# Patient Record
Sex: Female | Born: 2000 | Race: Asian | Hispanic: No | Marital: Single | Smoking: Never smoker
Health system: Southern US, Community
[De-identification: ages and names within clinical notes are randomized; demographics above are authoritative.]

## PROBLEM LIST (undated history)

## (undated) DIAGNOSIS — J45909 Unspecified asthma, uncomplicated: Secondary | ICD-10-CM

## (undated) HISTORY — DX: Unspecified asthma, uncomplicated: J45.909

---
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
## 2022-01-27 ENCOUNTER — Emergency Department (HOSPITAL_COMMUNITY)
Admission: EM | Admit: 2022-01-27 | Discharge: 2022-01-27 | Disposition: A | Discharge status: Home / Self Care | Payer: Medicaid Other | Attending: Student | Admitting: Student

## 2022-01-27 ENCOUNTER — Other Ambulatory Visit: Payer: Self-pay

## 2022-01-27 ENCOUNTER — Emergency Department (HOSPITAL_COMMUNITY): Payer: Medicaid Other

## 2022-01-27 ENCOUNTER — Encounter (HOSPITAL_COMMUNITY): Payer: Self-pay

## 2022-01-27 DIAGNOSIS — D72829 Elevated white blood cell count, unspecified: Secondary | ICD-10-CM | POA: Insufficient documentation

## 2022-01-27 DIAGNOSIS — N3000 Acute cystitis without hematuria: Secondary | ICD-10-CM | POA: Insufficient documentation

## 2022-01-27 DIAGNOSIS — J45909 Unspecified asthma, uncomplicated: Secondary | ICD-10-CM | POA: Diagnosis not present

## 2022-01-27 DIAGNOSIS — R109 Unspecified abdominal pain: Secondary | ICD-10-CM | POA: Diagnosis not present

## 2022-01-27 LAB — CBC WITH DIFFERENTIAL/PLATELET
Abs Immature Granulocytes: 0.03 10*3/uL (ref 0.00–0.07)
Basophils Absolute: 0 10*3/uL (ref 0.0–0.1)
Basophils Relative: 0 %
Eosinophils Absolute: 0.3 10*3/uL (ref 0.0–0.5)
Eosinophils Relative: 2 %
HCT: 39 % (ref 36.0–46.0)
Hemoglobin: 12.7 g/dL (ref 12.0–15.0)
Immature Granulocytes: 0 %
Lymphocytes Relative: 38 %
Lymphs Abs: 4.9 10*3/uL — ABNORMAL HIGH (ref 0.7–4.0)
MCH: 25.6 pg — ABNORMAL LOW (ref 26.0–34.0)
MCHC: 32.6 g/dL (ref 30.0–36.0)
MCV: 78.6 fL — ABNORMAL LOW (ref 80.0–100.0)
Monocytes Absolute: 0.6 10*3/uL (ref 0.1–1.0)
Monocytes Relative: 5 %
Neutro Abs: 7 10*3/uL (ref 1.7–7.7)
Neutrophils Relative %: 55 %
Platelets: 365 10*3/uL (ref 150–400)
RBC: 4.96 MIL/uL (ref 3.87–5.11)
RDW: 13.5 % (ref 11.5–15.5)
WBC: 12.9 10*3/uL — ABNORMAL HIGH (ref 4.0–10.5)
nRBC: 0 % (ref 0.0–0.2)

## 2022-01-27 LAB — COMPREHENSIVE METABOLIC PANEL
ALT: 12 U/L (ref 0–44)
AST: 20 U/L (ref 15–41)
Albumin: 4.4 g/dL (ref 3.5–5.0)
Alkaline Phosphatase: 50 U/L (ref 38–126)
Anion gap: 12 (ref 5–15)
BUN: 11 mg/dL (ref 6–20)
CO2: 21 mmol/L — ABNORMAL LOW (ref 22–32)
Calcium: 9.3 mg/dL (ref 8.9–10.3)
Chloride: 106 mmol/L (ref 98–111)
Creatinine, Ser: 0.93 mg/dL (ref 0.44–1.00)
GFR, Estimated: >60 mL/min (ref >60)
Glucose, Bld: 99 mg/dL (ref 70–99)
Potassium: 3.6 mmol/L (ref 3.5–5.1)
Sodium: 139 mmol/L (ref 135–145)
Total Bilirubin: 0.8 mg/dL (ref 0.3–1.2)
Total Protein: 7.6 g/dL (ref 6.5–8.1)

## 2022-01-27 LAB — URINALYSIS, ROUTINE W REFLEX MICROSCOPIC
Bilirubin Urine: NEGATIVE
Glucose, UA: NEGATIVE mg/dL
Hgb urine dipstick: NEGATIVE
Ketones, ur: 80 mg/dL — AB
Nitrite: NEGATIVE
Protein, ur: NEGATIVE mg/dL
Specific Gravity, Urine: 1.026 (ref 1.005–1.030)
pH: 5 (ref 5.0–8.0)

## 2022-01-27 LAB — I-STAT BETA HCG BLOOD, ED (MC, WL, AP ONLY): I-stat hCG, quantitative: <5 m[IU]/mL (ref <5)

## 2022-01-27 LAB — LIPASE, BLOOD: Lipase: 25 U/L (ref 11–51)

## 2022-01-27 MED ORDER — CEFADROXIL 500 MG PO CAPS
500.0000 mg | ORAL_CAPSULE | Freq: Two times a day (BID) | ORAL | Status: DC
  Administered 2022-01-27: 500 mg via ORAL
  Filled 2022-01-27 (×2): qty 1

## 2022-01-27 MED ORDER — VASOPRESSIN 20 UNITS/100 ML INFUSION FOR SHOCK: 0.0000 [IU]/min | INTRAVENOUS | Status: DC

## 2022-01-27 MED ORDER — CEFADROXIL 500 MG PO CAPS: 500.0000 mg | ORAL_CAPSULE | Freq: Two times a day (BID) | ORAL | 0 refills | Status: AC

## 2022-01-27 MED ORDER — IOHEXOL 300 MG/ML  SOLN
80.0000 mL | Freq: Once | INTRAMUSCULAR | Status: AC | PRN
  Administered 2022-01-27: 80 mL via INTRAVENOUS

## 2022-01-27 NOTE — ED Provider Triage Note (Signed)
Emergency Medicine Provider Triage Evaluation Note  Brenda Dickson , a 21 y.o. female  was evaluated in triage.  Pt complains of abdominal pain that began when she woke up @ 4:20AM. Pain is periumbilical, crampy in nature, waxing/waning no alleviating/aggravating factors. LMP @ the end of last month, not due for her cycle yet.   Review of Systems  Positive: Abdominal pain Negative: N/v/d, fever, vaginal bleeding, dysuria,   Physical Exam  BP 107/65 (BP Location: Right Arm)   Pulse 97   Temp 98.3 F (36.8 C) (Oral)   Resp 18   SpO2 97%  Gen:   Awake, no distress   Resp:  Normal effort  MSK:   Moves extremities without difficulty  Other:  No peritoneal signs.   Medical Decision Making  Medically screening exam initiated at 5:05 AM.  Appropriate orders placed.  Brenda Dickson was informed that the remainder of the evaluation will be completed by another provider, this initial triage assessment does not replace that evaluation, and the importance of remaining in the ED until their evaluation is complete.  Abdominal pain   Brenda Anderson, PA-C 01/27/22 [id number]

## 2022-01-27 NOTE — ED Provider Notes (Signed)
[facility] EMERGENCY DEPARTMENT Provider Note  CSN: [id number] Arrival date & time: 01/27/22 0445  Chief Complaint(s) Abdominal Pain  HPI Brenda Dickson is a 21 y.o. female who presents emergency department for evaluation of abdominal pain.  Patient states that pain began abruptly at 4:30 AM.  She did describes the pain as crampy and located in the suprapubic region.  She endorses nausea but denies vomiting or diarrhea.  Denies associated chest pain, shortness of breath, headache, fever or other systemic symptoms.  Unfortunately, the patient had a prolonged ER waiting room stay and on my evaluation her pain has improved but not totally resolved.  Denies constipation, vaginal bleeding or vaginal discharge.   Past Medical History Past Medical History:  Diagnosis Date   Asthma    [employer] 04/01/2020   Patient Active Problem List   Diagnosis Date Noted   Asthma 09/23/2011   Home Medication(s) Prior to Admission medications   Medication Sig Start Date End Date Taking? Authorizing Provider  cefadroxil (DURICEF) 500 MG capsule Take 1 capsule (500 mg total) by mouth 2 (two) times daily for 7 days. 01/27/22 02/03/22 Yes Fiorella Hanahan, MD  dimenhyDRINATE (DRAMAMINE PO) Take 2 tablets by mouth daily as needed (motion sickness).   Yes [provider]  Melatonin 10 MG TABS Take 10 mg by mouth at bedtime as needed (sleep).   Yes [provider]  scopolamine (TRANSDERM-SCOP) 1 MG/3DAYS Place 1 patch onto the skin every three (3) days as needed (motion sickness).   Yes [provider]                                                                                                                                    Past Surgical History History reviewed. No pertinent surgical history. Family History History reviewed. No pertinent family history.  Social History Social History   Tobacco Use   Smoking status: Never   Smokeless tobacco: Never  Substance  Use Topics   Alcohol use: No   Drug use: No   Allergies Patient has no known allergies.  Review of Systems Review of Systems  Gastrointestinal:  Positive for abdominal pain and nausea.    Physical Exam Vital Signs  I have reviewed the triage vital signs BP 115/69   Pulse 66   Temp 98 F (36.7 C) Comment: Simultaneous filing. User may not have seen previous data.  Resp 16 Comment: Simultaneous filing. User may not have seen previous data.  SpO2 100%   Physical Exam Vitals and nursing note reviewed.  Constitutional:      General: She is not in acute distress.    Appearance: She is well-developed.  HENT:     Head: Normocephalic and atraumatic.  Eyes:     Conjunctiva/sclera: Conjunctivae normal.  Cardiovascular:     Rate and Rhythm: Normal rate and regular rhythm.     Heart sounds: No murmur heard. Pulmonary:  Effort: Pulmonary effort is normal. No respiratory distress.     Breath sounds: Normal breath sounds.  Abdominal:     Palpations: Abdomen is soft.     Tenderness: There is abdominal tenderness in the periumbilical area and suprapubic area.  Musculoskeletal:        General: No swelling.     Cervical back: Neck supple.  Skin:    General: Skin is warm and dry.     Capillary Refill: Capillary refill takes less than 2 seconds.  Neurological:     Mental Status: She is alert.  Psychiatric:        Mood and Affect: Mood normal.     ED Results and Treatments Labs (all labs ordered are listed, but only abnormal results are displayed) Labs Reviewed  COMPREHENSIVE METABOLIC PANEL - Abnormal; Notable for the following components:      Result Value   CO2 21 (*)    All other components within normal limits  CBC WITH DIFFERENTIAL/PLATELET - Abnormal; Notable for the following components:   WBC 12.9 (*)    MCV 78.6 (*)    MCH 25.6 (*)    Lymphs Abs 4.9 (*)    All other components within normal limits  URINALYSIS, ROUTINE W REFLEX MICROSCOPIC - Abnormal; Notable  for the following components:   APPearance HAZY (*)    Ketones, ur 80 (*)    Leukocytes,Ua LARGE (*)    Bacteria, UA RARE (*)    All other components within normal limits  LIPASE, BLOOD  I-STAT BETA HCG BLOOD, ED ([facility], [facility], [facility] ONLY)                                                                                                                          Radiology CT ABDOMEN PELVIS W CONTRAST  Result Date: 01/27/2022 CLINICAL DATA:  Acute periumbilical abdominal pain. EXAM: CT ABDOMEN AND PELVIS WITH CONTRAST TECHNIQUE: Multidetector CT imaging of the abdomen and pelvis was performed using the standard protocol following bolus administration of intravenous contrast. RADIATION DOSE REDUCTION: This exam was performed according to the departmental dose-optimization program which includes automated exposure control, adjustment of the mA and/or kV according to patient size and/or use of iterative reconstruction technique. CONTRAST:  47mL OMNIPAQUE IOHEXOL 300 MG/ML  SOLN COMPARISON:  None Available. FINDINGS: Lower chest: No acute abnormality. Hepatobiliary: No focal liver abnormality is seen. No gallstones, gallbladder wall thickening, or biliary dilatation. Pancreas: Unremarkable. No pancreatic ductal dilatation or surrounding inflammatory changes. Spleen: Normal in size without focal abnormality. Adrenals/Urinary Tract: Adrenal glands are unremarkable. Kidneys are normal, without renal calculi, focal lesion, or hydronephrosis. Bladder is unremarkable. Stomach/Bowel: The stomach appears normal. There is no evidence of bowel obstruction or inflammation. The appendix is not clearly visualized, but no definite inflammation is noted in the right lower quadrant. Vascular/Lymphatic: No significant vascular findings are present. No enlarged abdominal or pelvic lymph nodes. Reproductive: Uterus and bilateral adnexa are unremarkable. Other: No abdominal wall hernia or abnormality.  No abdominopelvic ascites.  Musculoskeletal: No acute or significant osseous findings. IMPRESSION: No definite abnormality seen in the abdomen or pelvis. Electronically Signed   By: Lupita RaiderJames  Green Jr M.D.   On: 01/27/2022 13:45    Pertinent labs & imaging results that were available during my care of the patient were reviewed by me and considered in my medical decision making (see MDM for details).  Medications Ordered in ED Medications  cefadroxil (DURICEF) capsule 500 mg (500 mg Oral Given 01/27/22 1409)  iohexol (OMNIPAQUE) 300 MG/ML solution 80 mL (80 mLs Intravenous Contrast Given 01/27/22 1334)                                                                                                                                     Procedures Procedures  (including critical care time)  Medical Decision Making / ED Course   This patient presents to the ED for concern of abdominal pain, this involves an extensive number of treatment options, and is a complaint that carries with it a high risk of complications and morbidity.  The differential diagnosis includes cystitis, appendicitis, constipation, nephrolithiasis, intra-abdominal infection, musculoskeletal pain  MDM: Patient seen in the emergency room for evaluation of abdominal pain.  Physical exam reveals tenderness over the periumbilical region and in the suprapubic area.  Laboratory evaluation with leukocytosis to 12.9 but is otherwise unremarkable.  Urinalysis with large leuk esterase, 11-20 white blood cells, rare bacteria and 6-10 squamous epithelial cells.  Given patient's symptoms are in the suprapubic region with associated cramping and a possibly positive urinalysis, CT abdomen pelvis unremarkable with no evidence of appendicitis.  I do think the patient likely is suffering from urinary tract infection and we will treat with Duricef.  She was instructed to follow-up with her outpatient PCP and given strict return precautions which she voiced understanding.  Patient then  discharged with outpatient PCP follow-up.   Additional history obtained: -Additional history obtained from mother -External records from outside source obtained and reviewed including: Chart review including previous notes, labs, imaging, consultation notes   Lab Tests: -I ordered, reviewed, and interpreted labs.   The pertinent results include:   Labs Reviewed  COMPREHENSIVE METABOLIC PANEL - Abnormal; Notable for the following components:      Result Value   CO2 21 (*)    All other components within normal limits  CBC WITH DIFFERENTIAL/PLATELET - Abnormal; Notable for the following components:   WBC 12.9 (*)    MCV 78.6 (*)    MCH 25.6 (*)    Lymphs Abs 4.9 (*)    All other components within normal limits  URINALYSIS, ROUTINE W REFLEX MICROSCOPIC - Abnormal; Notable for the following components:   APPearance HAZY (*)    Ketones, ur 80 (*)    Leukocytes,Ua LARGE (*)    Bacteria, UA RARE (*)    All other components within normal limits  LIPASE, BLOOD  I-STAT  BETA HCG BLOOD, ED ([facility], [facility], [facility] ONLY)       Imaging Studies ordered: I ordered imaging studies including CTAP I independently visualized and interpreted imaging. I agree with the radiologist interpretation   Medicines ordered and prescription drug management: Meds ordered this encounter  Medications   iohexol (OMNIPAQUE) 300 MG/ML solution 80 mL   cefadroxil (DURICEF) capsule 500 mg   cefadroxil (DURICEF) 500 MG capsule    Sig: Take 1 capsule (500 mg total) by mouth 2 (two) times daily for 7 days.    Dispense:  14 capsule    Refill:  0   DISCONTD: vasopressin (PITRESSIN) 20 Units in sodium chloride 0.9 % 100 mL infusion-*FOR SHOCK*    -I have reviewed the patients home medicines and have made adjustments as needed  Critical interventions none  Cardiac Monitoring: The patient was maintained on a cardiac monitor.  I personally viewed and interpreted the cardiac monitored which showed an underlying rhythm of:  NSR  Social Determinants of Health:  Factors impacting patients care include: none   Reevaluation: After the interventions noted above, I reevaluated the patient and found that they have :improved  Co morbidities that complicate the patient evaluation  Past Medical History:  Diagnosis Date   Asthma    [employer] 04/01/2020      Dispostion: I considered admission for this patient, but with overall negative work-up and uncomplicated UTI, patient safe for discharge with outpatient follow-up.  Does not meet inpatient criteria for admission.     Final Clinical Impression(s) / ED Diagnoses Final diagnoses:  Acute cystitis without hematuria     @    Glendora Score, MD 01/27/22 [id number]

## 2022-01-27 NOTE — ED Triage Notes (Signed)
Pt reports she was having abd pain starting at 0430am. Pt reported she had a sharp cramp. Pt denies any N&V&D.

## 2022-01-27 NOTE — ED Notes (Signed)
DC instructions reviewed with pt. PT verbalized understanding. PT DC °

## 2022-01-29 NOTE — Progress Notes (Signed)
Subjective:    Brenda Dickson - 21 y.o. female MRN [id number]  Date of birth: 08-24-2000  HPI  Brenda Dickson is to establish care and emergency department follow-up.  Current issues and/or concerns: Emergency department follow-up: 01/27/2022 [facility] per MD note: MDM: Patient seen in the emergency room for evaluation of abdominal pain.  Physical exam reveals tenderness over the periumbilical region and in the suprapubic area.  Laboratory evaluation with leukocytosis to 12.9 but is otherwise unremarkable.  Urinalysis with large leuk esterase, 11-20 white blood cells, rare bacteria and 6-10 squamous epithelial cells.  Given patient's symptoms are in the suprapubic region with associated cramping and a possibly positive urinalysis, CT abdomen pelvis unremarkable with no evidence of appendicitis.  I do think the patient likely is suffering from urinary tract infection and we will treat with Duricef.  She was instructed to follow-up with her outpatient PCP and given strict return precautions which she voiced understanding.  Patient then discharged with outpatient PCP follow-up.  01/30/2022: Reports lingering stomach pain without additional symptoms. Began antibiotics 2 days ago.   2. Left ankle pain: Reports 2 weeks ago while hiking she twisted her ankle and thinks she sprained it. Reports was swollen at that time. Since then swelling has resolved. However, describes aching when walking. No additional symptoms.  3. Ear wax: Feels left ear wax is clogged. Tried digging it out. Reports she does wear headphones with volume probably louder than it should be.   4. Anxiety depression: Currently primarily related to family issues. Reports she is trying to find a job. Reports she tends to feel guilty about things. 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 History of having issues with school but currently no concerns with the same. Today when I asked if she has plans of suicide/self-harm/homicide she declined.  Reports she does have a history of self-cutting but has been at least 1 year since she did this last. Reports no current plans. Reports has thought of starving herself. States "Sometimes I just think it would be better if I drain into a puddle."     01/30/2022    9:57 AM  Depression screen PHQ 2/9  Decreased Interest 1  Down, Depressed, Hopeless 2  PHQ - 2 Score 3  Altered sleeping 3  Tired, decreased energy 1  Change in appetite 1  Feeling bad or failure about yourself  2  Trouble concentrating 0  Moving slowly or fidgety/restless 0  Suicidal thoughts 1  PHQ-9 Score 11  Difficult doing work/chores Somewhat difficult     ROS per HPI     Health Maintenance:  Health Maintenance Due  Topic Date Due   COVID-19 Vaccine (1) Never done   Hepatitis C Screening  Never done     Past Medical History: Patient Active Problem List   Diagnosis Date Noted   Asthma 09/23/2011     Social History   reports that she has never smoked. She has never been exposed to tobacco smoke. She has never used smokeless tobacco. She reports that she does not drink alcohol and does not use drugs.   Family History  family history is not on file.   Medications: reviewed and updated   Objective:   Physical Exam BP 122/79 (BP Location: Left Arm, Patient Position: Sitting, Cuff Size: Small)   Pulse 74   Temp 98.3 F (36.8 C)   Resp 18   Ht 4' 11.06" (1.5 m)   Wt 104 lb (47.2 kg)   SpO2 98%  BMI 20.97 kg/m   Physical Exam HENT:     Head: Normocephalic and atraumatic.     Right Ear: Tympanic membrane, ear canal and external ear normal.     Left Ear: Ear canal and external ear normal. There is impacted cerumen.  Eyes:     Extraocular Movements: Extraocular movements intact.     Conjunctiva/sclera: Conjunctivae normal.     Pupils: Pupils are equal, round, and reactive to light.  Cardiovascular:     Rate and Rhythm: Normal rate and regular rhythm.     Pulses: Normal pulses.     Heart  sounds: Normal heart sounds.  Pulmonary:     Effort: Pulmonary effort is normal.     Breath sounds: Normal breath sounds.  Musculoskeletal:        General: Normal range of motion.     Cervical back: Normal range of motion and neck supple.     Left lower leg: Normal.     Left ankle: Normal.     Left foot: Normal.  Neurological:     General: No focal deficit present.     Mental Status: She is alert and oriented to person, place, and time.  Psychiatric:        Mood and Affect: Mood normal.        Behavior: Behavior normal.       Assessment & Plan:  1. Encounter to establish care - Patient presents today to establish care.  - Return for annual physical examination, labs, and health maintenance. Arrive fasting meaning having no food for at least 8 hours prior to appointment. You may have only water or black coffee. Please take scheduled medications as normal.  2. Acute cystitis without hematuria - Continue Duricef as prescribed at emergency department discharge.  3. Abdominal pain, unspecified abdominal location - Cervicovaginal self-swab to screen for chlamydia, gonorrhea, trichomonas, bacterial vaginitis, and candida vaginitis. - Referral to Gastroenterology for further evaluation and management. - Cervicovaginal ancillary only - Ambulatory referral to Gastroenterology  4. Ceruminosis, left - Carbamide Peroxide otic solution as prescribed. Counseled on medication adherence and adverse effects.  - Return in 1 week for ear lavage with CMA.  - carbamide peroxide (DEBROX) 6.5 % OTIC solution; Place 5 drops into the left ear 2 (two) times daily. Do not use more than 4 days in a row.  Dispense: 15 mL; Refill: 0  5. Sprain of left ankle, unspecified ligament, initial encounter - Counseled on rest, ice elevation, as needed.  - Try course of over-the-counter analgesics.  - Follow-up with primary provider as scheduled.  6. Anxiety and depression - Patient denies thoughts of self-harm,  suicidal ideations, homicidal ideations. - Newly diagnosed.  - Begin Sertraline as prescribed.  Do not drink alcohol or use illicit substances with with this medication.  Avoid driving or hazardous activity until you know how this medication will affect you. Your reactions could be impaired. Dizziness or fainting can cause falls, accidents, or severe injuries. Common side effects include drowsiness, nausea, constipation, loss of appetite, dry mouth, increased sweating. Call your provider if you have pounding heartbeats or fluttering in your chest, a light-headed feeling like you may pass out, easy bruising/unusal bleeding, vision change, difficult or painful urination, impotence/sexual problems, liver problems (right-sided upper stomach pain, itching, dark urine, yellowing of skin or eyes/jaundice, low levels of sodium in the body (headache, confusion, slurred speech, severe weakness, vomiting, loss of coordination, feeling unsteady), or manic episodes (racing thoughts, increased energy, decreased need for sleep, risk-taking  behavior, being agitated, talkative) Seek medical attention immediately if you have symptoms of serotonin syndrome such as agitation, hallucinations, fever, sweating, shivering, fast heart rate, muscle stiffness, twitching, loss of coordination, nausea, vomiting, or diarrhea Report any new or worsening symptoms to your provider, such as but not limited to: mood or behavior changes, anxiety, panic attacks, trouble sleeping, or if you feel impulsive, irritable, agitated, hostile, aggressive, restless, hyperactive (mentally or physically), more depressed, or have thoughts about suicide or hurting yourself - Referral to Psychiatry for further evaluation and management.  - sertraline (ZOLOFT) 25 MG tablet; Take 1 tablet (25 mg total) by mouth daily.  Dispense: 30 tablet; Refill: 0 - Ambulatory referral to Psychiatry      Patient was given clear instructions to go to Emergency  Department or return to medical center if symptoms don't improve, worsen, or new problems develop.The patient verbalized understanding.  I discussed the assessment and treatment plan with the patient. The patient was provided an opportunity to ask questions and all were answered. The patient agreed with the plan and demonstrated an understanding of the instructions.   The patient was advised to call back or seek an in-person evaluation if the symptoms worsen or if the condition fails to improve as anticipated.    Ricky Stabs, NP 01/30/2022, 10:18 AM Primary Care at [facility]

## 2022-01-30 ENCOUNTER — Ambulatory Visit (INDEPENDENT_AMBULATORY_CARE_PROVIDER_SITE_OTHER): Payer: Medicaid Other | Admitting: Family

## 2022-01-30 ENCOUNTER — Other Ambulatory Visit (HOSPITAL_COMMUNITY)
Admission: RE | Admit: 2022-01-30 | Discharge: 2022-01-30 | Disposition: A | Discharge status: Home / Self Care | Payer: Medicaid Other | Source: Ambulatory Visit | Attending: Family | Admitting: Family

## 2022-01-30 ENCOUNTER — Encounter: Payer: Self-pay | Admitting: Family

## 2022-01-30 VITALS — BP 122/79 | HR 74 | Temp 98.3°F | Resp 18 | Ht 59.06 in | Wt 104.0 lb

## 2022-01-30 DIAGNOSIS — H6122 Impacted cerumen, left ear: Secondary | ICD-10-CM

## 2022-01-30 DIAGNOSIS — Z7689 Persons encountering health services in other specified circumstances: Secondary | ICD-10-CM

## 2022-01-30 DIAGNOSIS — S93402A Sprain of unspecified ligament of left ankle, initial encounter: Secondary | ICD-10-CM | POA: Diagnosis not present

## 2022-01-30 DIAGNOSIS — N3000 Acute cystitis without hematuria: Secondary | ICD-10-CM | POA: Diagnosis not present

## 2022-01-30 DIAGNOSIS — R109 Unspecified abdominal pain: Secondary | ICD-10-CM | POA: Insufficient documentation

## 2022-01-30 DIAGNOSIS — F419 Anxiety disorder, unspecified: Secondary | ICD-10-CM | POA: Diagnosis not present

## 2022-01-30 DIAGNOSIS — F32A Depression, unspecified: Secondary | ICD-10-CM | POA: Diagnosis not present

## 2022-01-30 MED ORDER — SERTRALINE HCL 25 MG PO TABS: 25.0000 mg | ORAL_TABLET | Freq: Every day | ORAL | 0 refills | Status: DC

## 2022-01-30 MED ORDER — CARBAMIDE PEROXIDE 6.5 % OT SOLN: 5.0000 [drp] | Freq: Two times a day (BID) | OTIC | 0 refills | Status: AC

## 2022-01-30 NOTE — Progress Notes (Signed)
Pt presents to establish care and ED f/u, states she is still experiencing abdominal pain states she just started antibiotics

## 2022-01-30 NOTE — Patient Instructions (Signed)
Thank you for choosing Primary Care at [facility] for your medical home!    Brenda Dickson was seen by Rema Fendt, NP today.   Brenda Dickson's primary care provider is Rema Fendt, NP.   For the best care possible,  you should try to see Brenda Stabs, NP whenever you come to office.   We look forward to seeing you again soon!  If you have any questions about your visit today,  please call us at [phone]  Or feel free to reach your provider via MyChart.     Urinary Tract Infection, Adult  A urinary tract infection (UTI) is an infection of any part of the urinary tract. The urinary tract includes the kidneys, ureters, bladder, and urethra. These organs make, store, and get rid of urine in the body. An upper UTI affects the ureters and kidneys. A lower UTI affects the bladder and urethra. What are the causes? Most urinary tract infections are caused by bacteria in your genital area around your urethra, where urine leaves your body. These bacteria grow and cause inflammation of your urinary tract. What increases the risk? You are more likely to develop this condition if: You have a urinary catheter that stays in place. You are not able to control when you urinate or have a bowel movement (incontinence). You are female and you: Use a spermicide or diaphragm for birth control. Have low estrogen levels. Are pregnant. You have certain genes that increase your risk. You are sexually active. You take antibiotic medicines. You have a condition that causes your flow of urine to slow down, such as: An enlarged prostate, if you are female. Blockage in your urethra. A kidney stone. A nerve condition that affects your bladder control (neurogenic bladder). Not getting enough to drink, or not urinating often. You have certain medical conditions, such as: Diabetes. A weak disease-fighting system (immunesystem). Sickle cell disease. Gout. Spinal cord injury. What are the signs or  symptoms? Symptoms of this condition include: Needing to urinate right away (urgency). Frequent urination. This may include small amounts of urine each time you urinate. Pain or burning with urination. Blood in the urine. Urine that smells bad or unusual. Trouble urinating. Cloudy urine. Vaginal discharge, if you are female. Pain in the abdomen or the lower back. You may also have: Vomiting or a decreased appetite. Confusion. Irritability or tiredness. A fever or chills. Diarrhea. The first symptom in older adults may be confusion. In some cases, they may not have any symptoms until the infection has worsened. How is this diagnosed? This condition is diagnosed based on your medical history and a physical exam. You may also have other tests, including: Urine tests. Blood tests. Tests for STIs (sexually transmitted infections). If you have had more than one UTI, a cystoscopy or imaging studies may be done to determine the cause of the infections. How is this treated? Treatment for this condition includes: Antibiotic medicine. Over-the-counter medicines to treat discomfort. Drinking enough water to stay hydrated. If you have frequent infections or have other conditions such as a kidney stone, you may need to see a health care provider who specializes in the urinary tract (urologist). In rare cases, urinary tract infections can cause sepsis. Sepsis is a life-threatening condition that occurs when the body responds to an infection. Sepsis is treated in the hospital with IV antibiotics, fluids, and other medicines. Follow these instructions at home:  Medicines Take over-the-counter and prescription medicines only as told by your health care  provider. If you were prescribed an antibiotic medicine, take it as told by your health care provider. Do not stop using the antibiotic even if you start to feel better. General instructions Make sure you: Empty your bladder often and completely.  Do not hold urine for long periods of time. Empty your bladder after sex. Wipe from front to back after urinating or having a bowel movement if you are female. Use each tissue only one time when you wipe. Drink enough fluid to keep your urine pale yellow. Keep all follow-up visits. This is important. Contact a health care provider if: Your symptoms do not get better after 1-2 days. Your symptoms go away and then return. Get help right away if: You have severe pain in your back or your lower abdomen. You have a fever or chills. You have nausea or vomiting. Summary A urinary tract infection (UTI) is an infection of any part of the urinary tract, which includes the kidneys, ureters, bladder, and urethra. Most urinary tract infections are caused by bacteria in your genital area. Treatment for this condition often includes antibiotic medicines. If you were prescribed an antibiotic medicine, take it as told by your health care provider. Do not stop using the antibiotic even if you start to feel better. Keep all follow-up visits. This is important. This information is not intended to replace advice given to you by your health care provider. Make sure you discuss any questions you have with your health care provider. Document Revised: 03/15/2020 Document Reviewed: 03/15/2020 Elsevier Patient Education  2023 [employer].

## 2022-02-01 LAB — CERVICOVAGINAL ANCILLARY ONLY
Bacterial Vaginitis (gardnerella): NEGATIVE
Candida Glabrata: NEGATIVE
Candida Vaginitis: NEGATIVE
Chlamydia: NEGATIVE
Comment: NEGATIVE
Comment: NEGATIVE
Comment: NEGATIVE
Comment: NEGATIVE
Comment: NEGATIVE
Comment: NORMAL
Neisseria Gonorrhea: NEGATIVE
Trichomonas: NEGATIVE

## 2022-03-03 ENCOUNTER — Other Ambulatory Visit: Payer: Self-pay | Admitting: Family

## 2022-03-03 DIAGNOSIS — F419 Anxiety disorder, unspecified: Secondary | ICD-10-CM

## 2022-03-05 MED ORDER — SERTRALINE HCL 25 MG PO TABS: 25.0000 mg | ORAL_TABLET | Freq: Every day | ORAL | 0 refills | Status: DC

## 2022-03-09 ENCOUNTER — Other Ambulatory Visit: Payer: Self-pay | Admitting: Family

## 2022-03-09 DIAGNOSIS — F32A Depression, unspecified: Secondary | ICD-10-CM

## 2022-03-10 ENCOUNTER — Encounter: Payer: Self-pay | Admitting: Family

## 2022-04-13 DIAGNOSIS — H5213 Myopia, bilateral: Secondary | ICD-10-CM | POA: Diagnosis not present

## 2022-05-04 ENCOUNTER — Encounter: Payer: Self-pay | Admitting: Family

## 2022-06-04 ENCOUNTER — Other Ambulatory Visit: Payer: Self-pay | Admitting: Family

## 2022-06-04 DIAGNOSIS — F419 Anxiety disorder, unspecified: Secondary | ICD-10-CM

## 2022-06-05 ENCOUNTER — Other Ambulatory Visit: Payer: Self-pay

## 2022-06-05 DIAGNOSIS — F419 Anxiety disorder, unspecified: Secondary | ICD-10-CM

## 2022-06-05 MED ORDER — SERTRALINE HCL 25 MG PO TABS: 25.0000 mg | ORAL_TABLET | Freq: Every day | ORAL | 0 refills | Status: DC

## 2022-08-04 ENCOUNTER — Telehealth: Payer: Medicaid Other | Admitting: Physician Assistant

## 2022-08-04 DIAGNOSIS — J069 Acute upper respiratory infection, unspecified: Secondary | ICD-10-CM

## 2022-08-04 MED ORDER — FLUTICASONE PROPIONATE 50 MCG/ACT NA SUSP: 2.0000 | Freq: Every day | NASAL | 0 refills | Status: DC

## 2022-08-04 MED ORDER — BENZONATATE 100 MG PO CAPS: 100.0000 mg | ORAL_CAPSULE | Freq: Three times a day (TID) | ORAL | 0 refills | Status: DC | PRN

## 2022-08-04 NOTE — Progress Notes (Signed)
I have spent 5 minutes in review of e-visit questionnaire, review and updating patient chart, medical decision making and response to patient.   Dnyla Antonetti Cody Charlene Cowdrey, PA-C    

## 2022-08-04 NOTE — Progress Notes (Signed)

## 2022-08-31 ENCOUNTER — Other Ambulatory Visit: Payer: Self-pay | Admitting: Family

## 2022-08-31 DIAGNOSIS — F419 Anxiety disorder, unspecified: Secondary | ICD-10-CM

## 2022-11-26 ENCOUNTER — Other Ambulatory Visit: Payer: Self-pay | Admitting: Family

## 2022-11-26 DIAGNOSIS — F32A Depression, unspecified: Secondary | ICD-10-CM

## 2022-11-26 NOTE — Telephone Encounter (Signed)
Please call patient with update. Sertraline courtesy order complete. Schedule appointment for additional refills.

## 2022-12-06 ENCOUNTER — Telehealth: Payer: BC Managed Care – PPO | Admitting: Family Medicine

## 2022-12-06 ENCOUNTER — Telehealth: Payer: BC Managed Care – PPO

## 2022-12-06 DIAGNOSIS — R399 Unspecified symptoms and signs involving the genitourinary system: Secondary | ICD-10-CM

## 2022-12-06 MED ORDER — CEPHALEXIN 500 MG PO CAPS: 500.0000 mg | ORAL_CAPSULE | Freq: Two times a day (BID) | ORAL | 0 refills | Status: AC

## 2022-12-06 NOTE — Progress Notes (Signed)

## 2023-01-23 IMAGING — CT CT ABD-PELV W/ CM
2 of 4 series · 16 of 46 positions shown, 18 images · IV contrast (APPLIED)
Comparison: None Available.

CLINICAL DATA: Acute periumbilical abdominal pain.

EXAM:
CT ABDOMEN AND PELVIS WITH CONTRAST
TECHNIQUE: Multidetector CT imaging of the abdomen and pelvis was performed
using the standard protocol following bolus administration of
intravenous contrast.

[Series 3: abd/ pelvis 5.0 i30f 2 · axial · 0.74mm/px · z∈[-683,-303]mm · 13 of 84 slices shown, 15 images]
[im 4/84  soft-tissue]
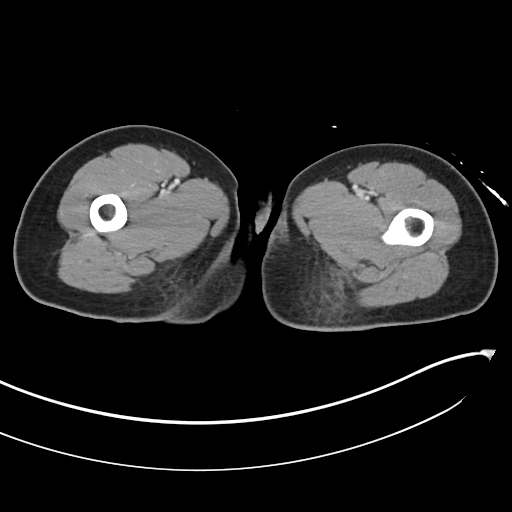
[im 4/84  bone]
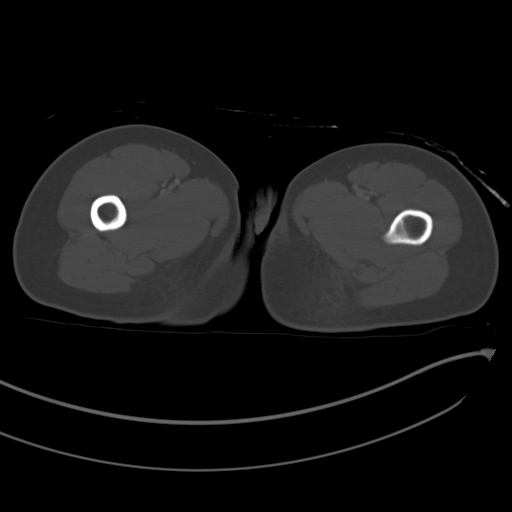
[im 11/84  soft-tissue]
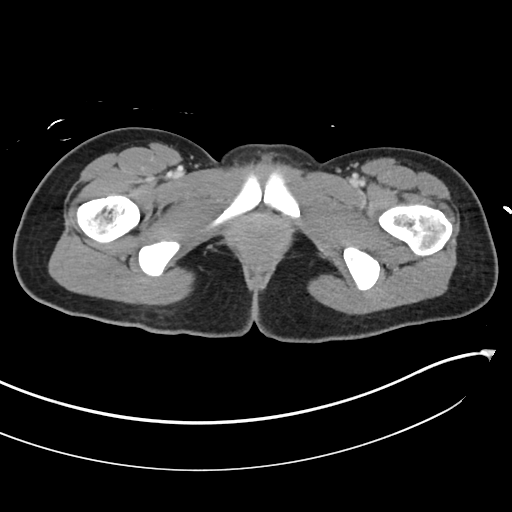
[im 18/84  soft-tissue]
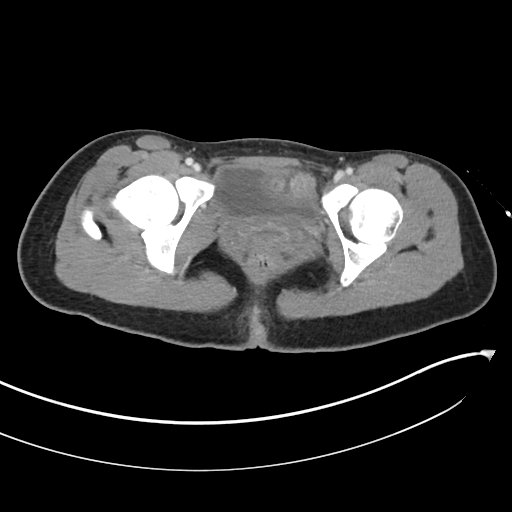
[im 25/84  soft-tissue]
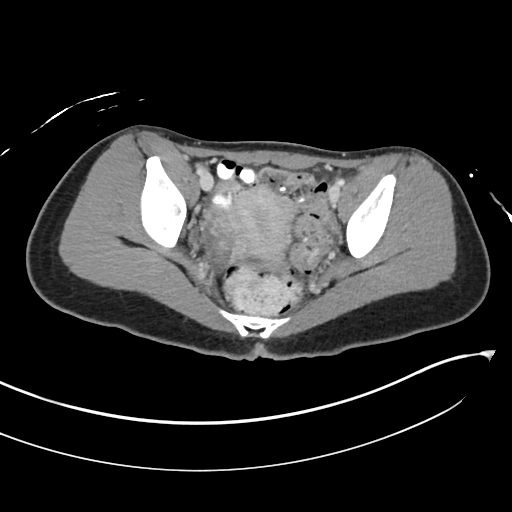
[im 28/84  soft-tissue]
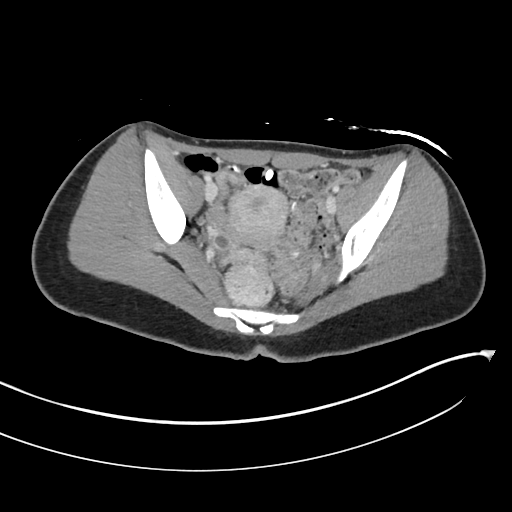
[im 35/84  soft-tissue]
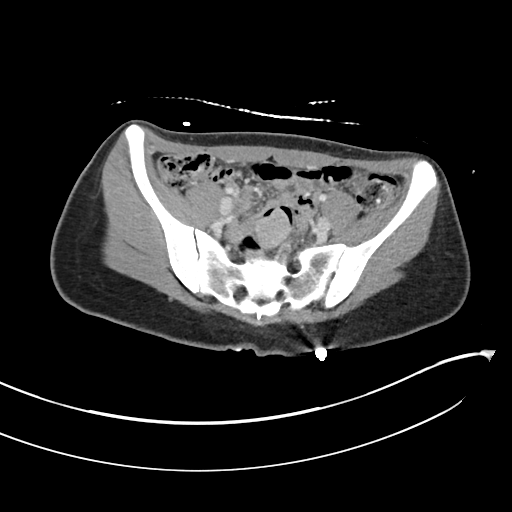
[im 42/84  soft-tissue]
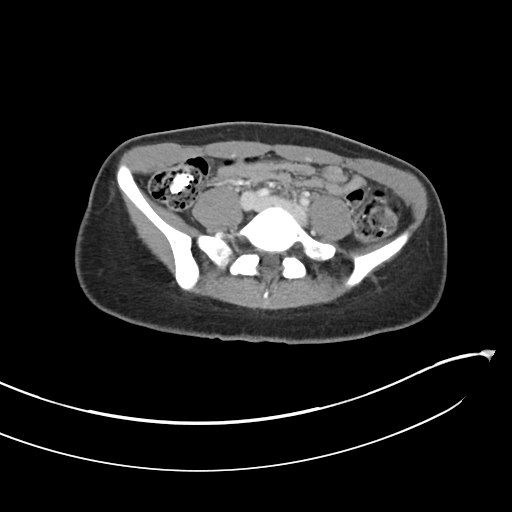
[im 49/84  soft-tissue]
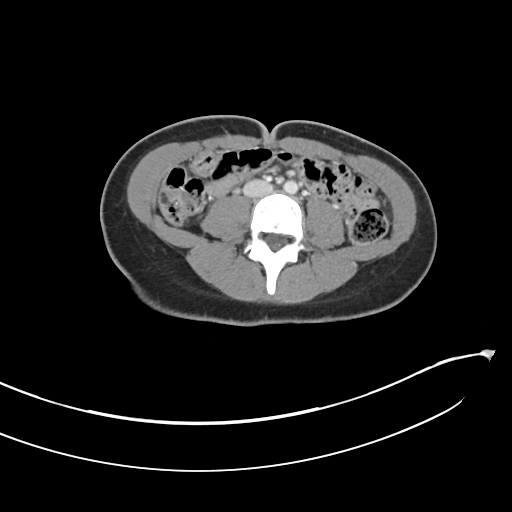
[im 56/84  soft-tissue]
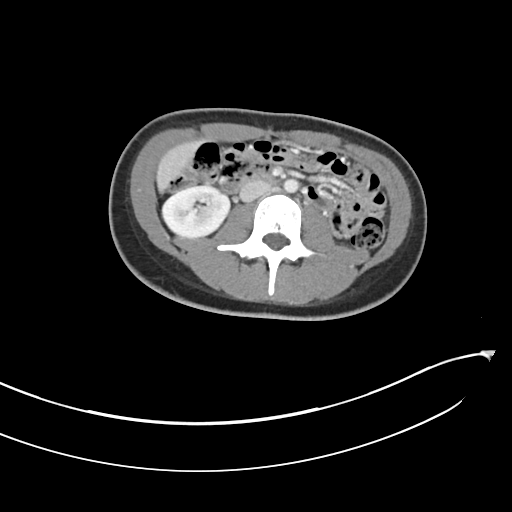
[im 56/84  bone]
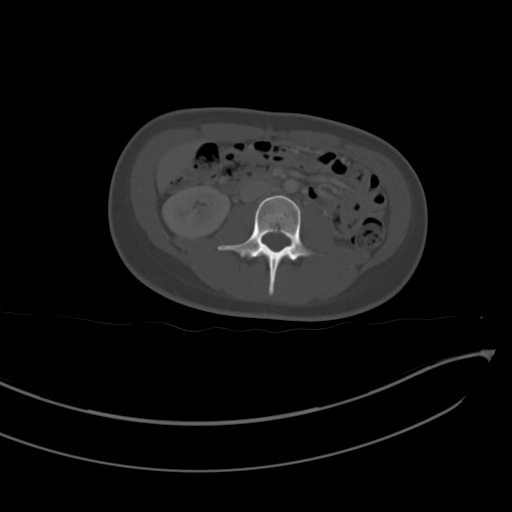
[im 59/84  soft-tissue]
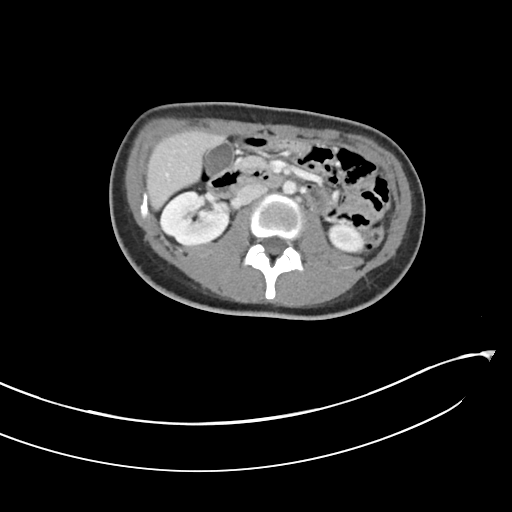
[im 66/84  soft-tissue]
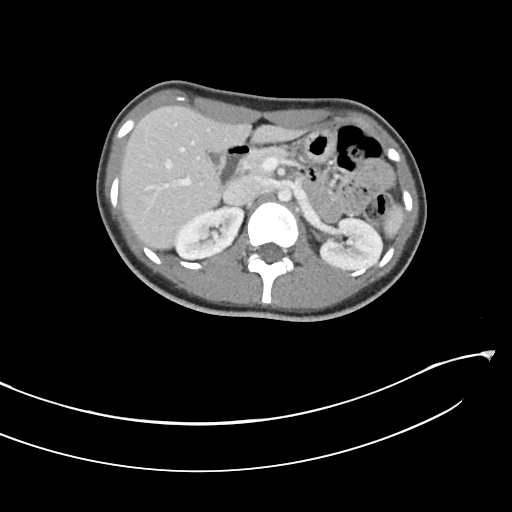
[im 73/84  soft-tissue]
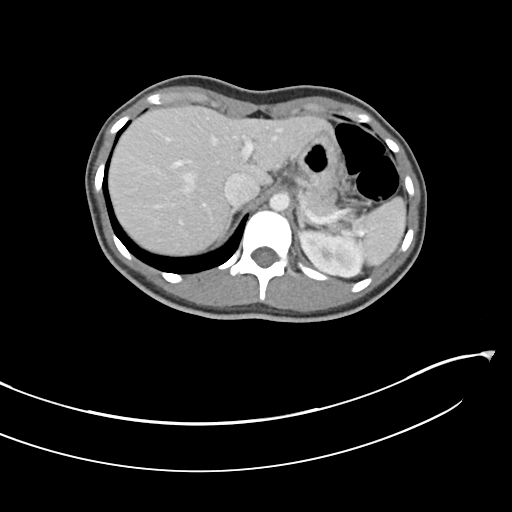
[im 80/84  soft-tissue]
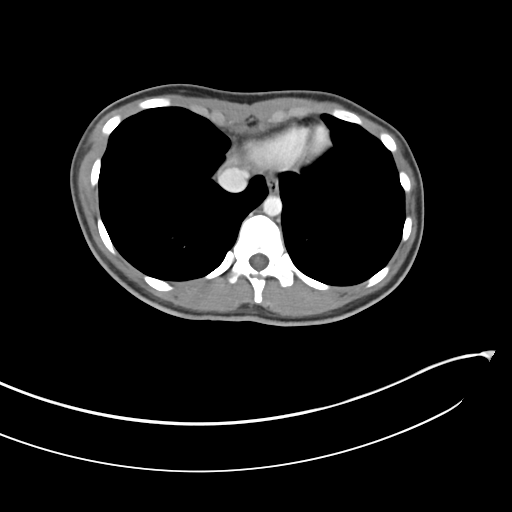

[Series 6: coronal soft tissue · coronal · 0.69mm/px · 3 of 82 slices shown]
[im 28/82  soft-tissue]
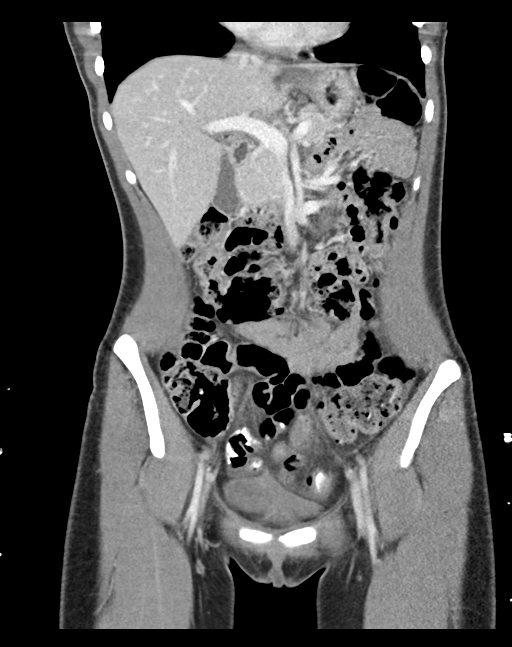
[im 37/82  soft-tissue]
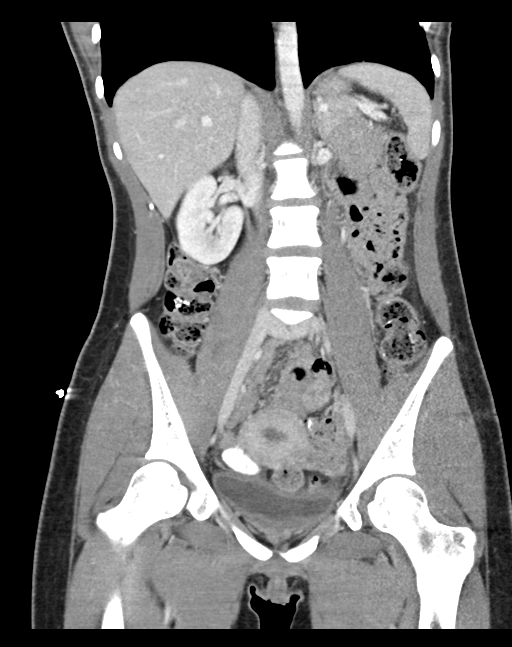
[im 46/82  soft-tissue]
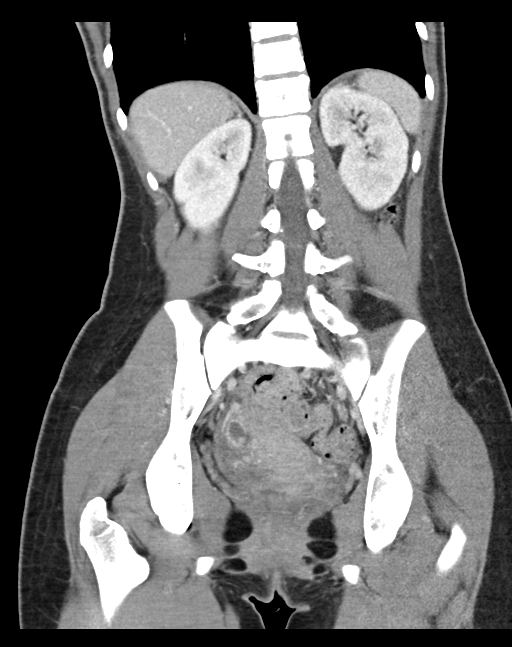

[16 of 46 positions shown; findings below may reference images not displayed]

RADIATION DOSE REDUCTION: This exam was performed according to the
departmental dose-optimization program which includes automated
exposure control, adjustment of the mA and/or kV according to
patient size and/or use of iterative reconstruction technique.

CONTRAST:  80mL OMNIPAQUE IOHEXOL 300 MG/ML  SOLN
FINDINGS: Lower chest: No acute abnormality.

Hepatobiliary: No focal liver abnormality is seen. No gallstones,
gallbladder wall thickening, or biliary dilatation.

Pancreas: Unremarkable. No pancreatic ductal dilatation or
surrounding inflammatory changes.

Spleen: Normal in size without focal abnormality.

Adrenals/Urinary Tract: Adrenal glands are unremarkable. Kidneys are
normal, without renal calculi, focal lesion, or hydronephrosis.
Bladder is unremarkable.

Stomach/Bowel: The stomach appears normal. There is no evidence of
bowel obstruction or inflammation. The appendix is not clearly
visualized, but no definite inflammation is noted in the right lower
quadrant.

Vascular/Lymphatic: No significant vascular findings are present. No
enlarged abdominal or pelvic lymph nodes.

Reproductive: Uterus and bilateral adnexa are unremarkable.

Other: No abdominal wall hernia or abnormality. No abdominopelvic
ascites.

Musculoskeletal: No acute or significant osseous findings.
IMPRESSION: No definite abnormality seen in the abdomen or pelvis.

## 2023-08-01 ENCOUNTER — Telehealth: Payer: BC Managed Care – PPO | Admitting: Family

## 2023-08-01 DIAGNOSIS — R399 Unspecified symptoms and signs involving the genitourinary system: Secondary | ICD-10-CM | POA: Diagnosis not present

## 2023-08-01 MED ORDER — CEPHALEXIN 500 MG PO CAPS: 500.0000 mg | ORAL_CAPSULE | Freq: Two times a day (BID) | ORAL | 0 refills | Status: AC

## 2023-08-01 NOTE — Progress Notes (Signed)
E-Visit for Urinary Problems  We are sorry that you are not feeling well.  Here is how we plan to help!  Based on what you shared with me it looks like you most likely have a simple urinary tract infection.  A UTI (Urinary Tract Infection) is a bacterial infection of the bladder.  Most cases of urinary tract infections are simple to treat but a key part of your care is to encourage you to drink plenty of fluids and watch your symptoms carefully.  I have prescribed Keflex 500 mg twice a day for 7 days.  Your symptoms should gradually improve. Call us if the burning in your urine worsens, you develop worsening fever, back pain or pelvic pain or if your symptoms do not resolve after completing the antibiotic.  Urinary tract infections can be prevented by drinking plenty of water to keep your body hydrated.  Also be sure when you wipe, wipe from front to back and don't hold it in!  If possible, empty your bladder every 4 hours.  HOME CARE Drink plenty of fluids Compete the full course of the antibiotics even if the symptoms resolve Remember, when you need to go.go. Holding in your urine can increase the likelihood of getting a UTI! GET HELP RIGHT AWAY IF: You cannot urinate You get a high fever Worsening back pain occurs You see blood in your urine You feel sick to your stomach or throw up You feel like you are going to pass out  MAKE SURE YOU  Understand these instructions. Will watch your condition. Will get help right away if you are not doing well or get worse.   Thank you for choosing an e-visit.  Your e-visit answers were reviewed by a board certified advanced clinical practitioner to complete your personal care plan. Depending upon the condition, your plan could have included both over the counter or prescription medications.  Please review your pharmacy choice. Make sure the pharmacy is open so you can pick up prescription now. If there is a problem, you may contact your  provider through MyChart messaging and have the prescription routed to another pharmacy.  Your safety is important to us. If you have drug allergies check your prescription carefully.   For the next 24 hours you can use MyChart to ask questions about today's visit, request a non-urgent call back, or ask for a work or school excuse. You will get an email in the next two days asking about your experience. I hope that your e-visit has been valuable and will speed your recovery.  Approximately 5 minutes was spent documenting and reviewing patient's chart.    

## 2023-08-14 ENCOUNTER — Telehealth: Payer: BC Managed Care – PPO | Admitting: Physician Assistant

## 2023-08-14 DIAGNOSIS — R6889 Other general symptoms and signs: Secondary | ICD-10-CM

## 2023-08-14 MED ORDER — IBUPROFEN 600 MG PO TABS: 600.0000 mg | ORAL_TABLET | Freq: Three times a day (TID) | ORAL | 0 refills | Status: AC | PRN

## 2023-08-14 MED ORDER — FLUTICASONE PROPIONATE 50 MCG/ACT NA SUSP: 2.0000 | Freq: Every day | NASAL | 6 refills | Status: AC

## 2023-08-14 MED ORDER — BENZONATATE 100 MG PO CAPS: 100.0000 mg | ORAL_CAPSULE | Freq: Two times a day (BID) | ORAL | 0 refills | Status: AC | PRN

## 2023-08-14 MED ORDER — CETIRIZINE HCL 10 MG PO TABS: 10.0000 mg | ORAL_TABLET | Freq: Every day | ORAL | 0 refills | Status: AC

## 2023-08-14 NOTE — Progress Notes (Signed)

## 2023-08-14 NOTE — Progress Notes (Signed)
I have spent 5 minutes in review of e-visit questionnaire, review and updating patient chart, medical decision making and response to patient.   Laure Kidney, PA-C

## 2023-08-21 ENCOUNTER — Encounter: Payer: BC Managed Care – PPO | Admitting: Nurse Practitioner

## 2023-08-21 DIAGNOSIS — R399 Unspecified symptoms and signs involving the genitourinary system: Secondary | ICD-10-CM

## 2023-08-21 NOTE — Progress Notes (Signed)
 Because you were recently treated for a UTI in December you will need to follow up in person for your urine to be tested and cultured.  Please contact your primary care physician practice to be seen. Many offices offer virtual options to be seen via video if you are not comfortable going in person to a medical facility at this time.  NOTE: You will NOT be charged for this eVisit.  If you do not have a PCP, [facility] offers a free physician referral service available at [phone]. Our trained staff has the experience, knowledge and resources to put you in touch with a physician who is right for you.    If you are having a true medical emergency please call 911.   Your e-visit answers were reviewed by a board certified advanced clinical practitioner to complete your personal care plan.  Thank you for using e-Visits.

## 2023-08-21 NOTE — Progress Notes (Signed)
 I have spent 5 minutes in review of e-visit questionnaire, review and updating patient chart, medical decision making and response to patient.   Claiborne Rigg, NP

## 2023-08-24 ENCOUNTER — Ambulatory Visit (INDEPENDENT_AMBULATORY_CARE_PROVIDER_SITE_OTHER): Payer: BC Managed Care – PPO | Admitting: Family

## 2023-08-24 ENCOUNTER — Other Ambulatory Visit: Payer: Self-pay | Admitting: Family

## 2023-08-24 VITALS — BP 129/85 | HR 114 | Temp 98.2°F | Ht 61.0 in | Wt 116.0 lb

## 2023-08-24 DIAGNOSIS — R059 Cough, unspecified: Secondary | ICD-10-CM | POA: Diagnosis not present

## 2023-08-24 DIAGNOSIS — H9203 Otalgia, bilateral: Secondary | ICD-10-CM

## 2023-08-24 DIAGNOSIS — M25572 Pain in left ankle and joints of left foot: Secondary | ICD-10-CM | POA: Diagnosis not present

## 2023-08-24 DIAGNOSIS — Z23 Encounter for immunization: Secondary | ICD-10-CM | POA: Diagnosis not present

## 2023-08-24 DIAGNOSIS — R399 Unspecified symptoms and signs involving the genitourinary system: Secondary | ICD-10-CM | POA: Diagnosis not present

## 2023-08-24 DIAGNOSIS — N3000 Acute cystitis without hematuria: Secondary | ICD-10-CM

## 2023-08-24 LAB — POCT URINALYSIS DIP (CLINITEK)
Blood, UA: NEGATIVE
Glucose, UA: NEGATIVE mg/dL
Leukocytes, UA: NEGATIVE
Nitrite, UA: POSITIVE — AB
POC PROTEIN,UA: 30 — AB
Spec Grav, UA: >=1.03 — AB (ref 1.010–1.025)
Urobilinogen, UA: 1 U/dL
pH, UA: 5.5 (ref 5.0–8.0)

## 2023-08-24 LAB — POCT RAPID STREP A (OFFICE): Rapid Strep A Screen: NEGATIVE

## 2023-08-24 MED ORDER — AMOXICILLIN-POT CLAVULANATE 875-125 MG PO TABS: 1.0000 | ORAL_TABLET | Freq: Two times a day (BID) | ORAL | 0 refills | Status: AC

## 2023-08-24 MED ORDER — NITROFURANTOIN MONOHYD MACRO 100 MG PO CAPS: 100.0000 mg | ORAL_CAPSULE | Freq: Two times a day (BID) | ORAL | 0 refills | Status: AC

## 2023-08-24 NOTE — Progress Notes (Signed)
 Patient states sometimes she has ankle pain that effects her at night.   Wants Flu vaccine.

## 2023-08-24 NOTE — Progress Notes (Signed)
 Patient ID: Brenda Dickson, female    DOB: August 17, 2001  MRN: [id number]  CC: Follow-Up  Subjective: Brenda Dickson is a 23 y.o. female who presents for follow-up.  Her concerns today include:  - Frequent and painful urination causing stomach discomfort. Denies red flag symptoms. Taking over-the-counter AZO to help.  - Nonproductive cough. Denies red flag symptoms. Taking over-the-counter medications.  - Bilateral ears feel clogged. Denies red flag symptoms.  - Intermittent left ankle pain. Worse during nighttime. States thinks she has gout. Denies recent trauma/injury and red flag symptoms.    Patient Active Problem List   Diagnosis Date Noted   Anxiety and depression 01/30/2022   Asthma 09/23/2011     Current Outpatient Medications on File Prior to Visit  Medication Sig Dispense Refill   benzonatate  (TESSALON ) 100 MG capsule Take 1 capsule (100 mg total) by mouth 2 (two) times daily as needed for cough. 20 capsule 0   cetirizine  (ZYRTEC  ALLERGY) 10 MG tablet Take 1 tablet (10 mg total) by mouth daily for 14 days. 14 tablet 0   dimenhyDRINATE (DRAMAMINE PO) Take 2 tablets by mouth daily as needed (motion sickness).     fluticasone  (FLONASE ) 50 MCG/ACT nasal spray Place 2 sprays into both nostrils daily. 16 g 6   ibuprofen  (ADVIL ) 600 MG tablet Take 1 tablet (600 mg total) by mouth every 8 (eight) hours as needed. 30 tablet 0   Melatonin 10 MG TABS Take 10 mg by mouth at bedtime as needed (sleep).     carbamide peroxide (DEBROX) 6.5 % OTIC solution Place 5 drops into the left ear 2 (two) times daily. Do not use more than 4 days in a row. (Patient not taking: Reported on 08/24/2023) 15 mL 0   cephALEXin  (KEFLEX ) 500 MG capsule Take 1 capsule (500 mg total) by mouth 2 (two) times daily. (Patient not taking: Reported on 08/24/2023) 14 capsule 0   scopolamine (TRANSDERM-SCOP) 1 MG/3DAYS Place 1 patch onto the skin every three (3) days as needed (motion sickness). (Patient not taking: Reported on  08/24/2023)     sertraline  (ZOLOFT ) 25 MG tablet TAKE 1 TABLET(25 MG) BY MOUTH DAILY (Patient not taking: Reported on 08/24/2023) 30 tablet 0   No current facility-administered medications on file prior to visit.    No Known Allergies  Social History   Socioeconomic History   Marital status: Single    Spouse name: Not on file   Number of children: Not on file   Years of education: Not on file   Highest education level: GED or equivalent  Occupational History   Not on file  Tobacco Use   Smoking status: Never    Passive exposure: Never   Smokeless tobacco: Never  Substance and Sexual Activity   Alcohol use: No   Drug use: No   Sexual activity: Not on file  Other Topics Concern   Not on file  Social History Narrative   Not on file   Social Drivers of Health   Financial Resource Strain: Patient Declined (08/24/2023)   Overall Financial Resource Strain (CARDIA)    Difficulty of Paying Living Expenses: Patient declined  Food Insecurity: Patient Declined (08/24/2023)   Hunger Vital Sign    Worried About Running Out of Food in the Last Year: Patient declined    Ran Out of Food in the Last Year: Patient declined  Transportation Needs: No Transportation Needs (08/24/2023)   PRAPARE - Administrator, Civil Service (Medical): No  Lack of Transportation (Non-Medical): No  Physical Activity: Insufficiently Active (08/24/2023)   Exercise Vital Sign    Days of Exercise per Week: 2 days    Minutes of Exercise per Session: 60 min  Stress: Stress Concern Present (08/24/2023)   [employer] of Occupational Health - Occupational Stress Questionnaire    Feeling of Stress : To some extent  Social Connections: Moderately Isolated (08/24/2023)   Social Connection and Isolation Panel [NHANES]    Frequency of Communication with Friends and Family: Once a week    Frequency of Social Gatherings with Friends and Family: Once a week    Attends Religious Services: More than 4 times per year     Active Member of [employer] or Organizations: Yes    Attends Engineer, Structural: More than 4 times per year    Marital Status: Never married  Intimate Partner Violence: Not on file    No family history on file.  No past surgical history on file.  ROS: Review of Systems Negative except as stated above  PHYSICAL EXAM: BP 129/85   Pulse (!) 114   Temp 98.2 F (36.8 C) (Oral)   Ht 5' 1 (1.549 m)   Wt 116 lb (52.6 kg)   LMP 08/15/2023 (Exact Date)   SpO2 91%   BMI 21.92 kg/m   Physical Exam HENT:     Head: Normocephalic and atraumatic.     Right Ear: Ear canal and external ear normal. Tympanic membrane is erythematous.     Left Ear: Tympanic membrane, ear canal and external ear normal.     Nose: Nose normal.     Mouth/Throat:     Mouth: Mucous membranes are moist.     Pharynx: Oropharynx is clear.  Eyes:     Extraocular Movements: Extraocular movements intact.     Conjunctiva/sclera: Conjunctivae normal.     Pupils: Pupils are equal, round, and reactive to light.  Cardiovascular:     Rate and Rhythm: Tachycardia present.     Pulses: Normal pulses.     Heart sounds: Normal heart sounds.  Pulmonary:     Effort: Pulmonary effort is normal.     Breath sounds: Normal breath sounds.  Abdominal:     General: Bowel sounds are normal.     Palpations: Abdomen is soft.  Musculoskeletal:        General: Normal range of motion.     Cervical back: Normal range of motion and neck supple.  Neurological:     General: No focal deficit present.     Mental Status: She is alert and oriented to person, place, and time.  Psychiatric:        Mood and Affect: Mood normal.        Behavior: Behavior normal.     ASSESSMENT AND PLAN: 1. Lower urinary tract symptoms (LUTS) (Primary) - Routine screening.  - POCT URINALYSIS DIP (CLINITEK); Future - Urine Culture  2. Cough, unspecified type - Patient today in office with no cardiopulmonary/acute distress.  - Routine  screening.  - COVID-19, Flu A+B and RSV - POCT rapid strep A; Future - Culture, Group A Strep  3. Discomfort of both ears - Amoxicillin -Clavulanate as prescribed. Counseled on medication adherence/adverse effects.  - Follow-up with primary provider as scheduled.  - amoxicillin -clavulanate (AUGMENTIN ) 875-125 MG tablet; Take 1 tablet by mouth 2 (two) times daily.  Dispense: 20 tablet; Refill: 0  4. Left ankle pain, unspecified chronicity - Routine screening.  - Follow-up with primary provider as  scheduled.  - Uric Acid  5. Encounter for immunization - Administered.  - Flu vaccine trivalent PF, 6mos and older(Flulaval,Afluria,Fluarix,Fluzone)    Patient was given the opportunity to ask questions.  Patient verbalized understanding of the plan and was able to repeat key elements of the plan. Patient was given clear instructions to go to Emergency Department or return to medical center if symptoms don't improve, worsen, or new problems develop.The patient verbalized understanding.   Orders Placed This Encounter  Procedures   Urine Culture   COVID-19, Flu A+B and RSV   Culture, Group A Strep   Flu vaccine trivalent PF, 6mos and older(Flulaval,Afluria,Fluarix,Fluzone)   Uric Acid   POCT URINALYSIS DIP (CLINITEK)   POCT rapid strep A     Requested Prescriptions   Signed Prescriptions Disp Refills   amoxicillin -clavulanate (AUGMENTIN ) 875-125 MG tablet 20 tablet 0    Sig: Take 1 tablet by mouth 2 (two) times daily.    Follow-up with primary provider as scheduled.  Greig JINNY Drones, NP

## 2023-08-25 LAB — COVID-19, FLU A+B AND RSV
Influenza A, NAA: NOT DETECTED
Influenza B, NAA: NOT DETECTED
RSV, NAA: NOT DETECTED
SARS-CoV-2, NAA: NOT DETECTED

## 2023-08-25 LAB — URIC ACID: Uric Acid: 4.5 mg/dL (ref 2.6–6.2)

## 2023-08-26 LAB — CULTURE, GROUP A STREP: Strep A Culture: NEGATIVE

## 2023-08-27 LAB — URINE CULTURE

## 2023-08-30 ENCOUNTER — Other Ambulatory Visit: Payer: Self-pay | Admitting: Family

## 2023-08-30 ENCOUNTER — Encounter: Payer: Self-pay | Admitting: Family

## 2023-08-30 DIAGNOSIS — N3000 Acute cystitis without hematuria: Secondary | ICD-10-CM

## 2023-08-30 MED ORDER — AMOXICILLIN-POT CLAVULANATE 875-125 MG PO TABS: 1.0000 | ORAL_TABLET | Freq: Two times a day (BID) | ORAL | 0 refills | Status: DC

## 2023-08-31 ENCOUNTER — Other Ambulatory Visit: Payer: Self-pay | Admitting: Family

## 2023-08-31 DIAGNOSIS — H9203 Otalgia, bilateral: Secondary | ICD-10-CM

## 2023-08-31 NOTE — Telephone Encounter (Signed)
 Continue with plan discussed in office. Also, referral to ENT. Expect call soon with appointment details.

## 2023-09-03 ENCOUNTER — Encounter (INDEPENDENT_AMBULATORY_CARE_PROVIDER_SITE_OTHER): Payer: Self-pay | Admitting: Otolaryngology

## 2023-10-12 ENCOUNTER — Institutional Professional Consult (permissible substitution) (INDEPENDENT_AMBULATORY_CARE_PROVIDER_SITE_OTHER): Payer: BC Managed Care – PPO
